# Patient Record
Sex: Female | Born: 1940 | Race: White | Hispanic: No
Health system: Southern US, Community
[De-identification: ages and names within clinical notes are randomized; demographics above are authoritative.]

---

## 2008-04-28 ENCOUNTER — Ambulatory Visit (HOSPITAL_COMMUNITY): Admission: RE | Admit: 2008-04-28 | Discharge: 2008-04-28 | Payer: Self-pay | Admitting: General Practice

## 2010-01-27 IMAGING — PT NM PET TUM IMG SKULL BASE T - THIGH
7 series · 25 of 25 positions shown · non-contrast
Comparison: [facility] chest CT 04/20/2008, [place] abdomen/pelvis CT 01/28/2008

CLINICAL DATA: Lung mass on outside chest CT

NUCLEAR MEDICINE WHOLE BODY PET/CT
TECHNIQUE: 15.7 mCi F-18 FDG was injected intravenously via the
right cephalic vein IV site.  Full-ring PET imaging was performed
from the skull vertex through the lower extremities 65 minutes
after injection.  CT data was obtained and used for attenuation
correction and anatomic localization only.  (This was not acquired
as a diagnostic CT examination.)
Fasting Blood Glucose:  115

[Series 1: pet ac · axial · 3.3mm · 4.69mm/px · z∈[-739,-13]mm · 5 of 223 slices shown]
[im 1/223]
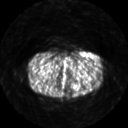
[im 56/223]
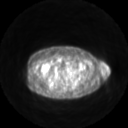
[im 112/223]
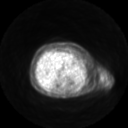
[im 167/223]
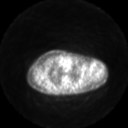
[im 223/223]
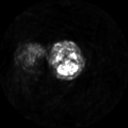

[Series 2: pet nac · axial · 3.3mm · 4.69mm/px · z∈[-739,-13]mm · 6 of 223 slices shown]
[im 1/223]
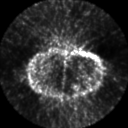
[im 45/223]
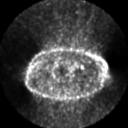
[im 89/223]
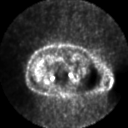
[im 134/223]
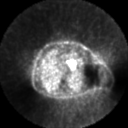
[im 178/223]
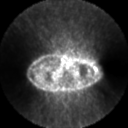
[im 223/223]
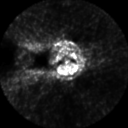

[Series 2: ct images · axial · 3.8mm · 0.98mm/px · z∈[-739,-13]mm · 5 of 223 slices shown]
[im 1/223]
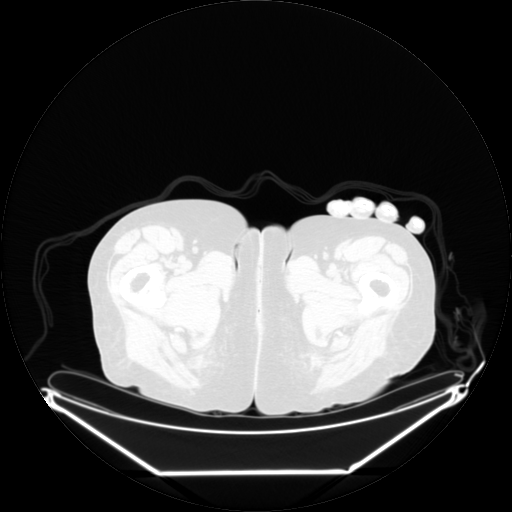
[im 56/223]
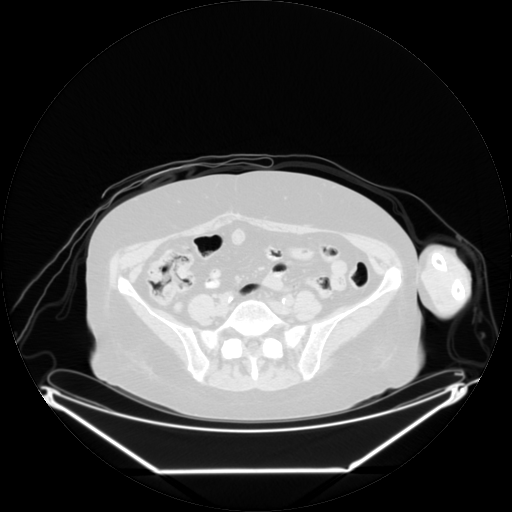
[im 112/223]
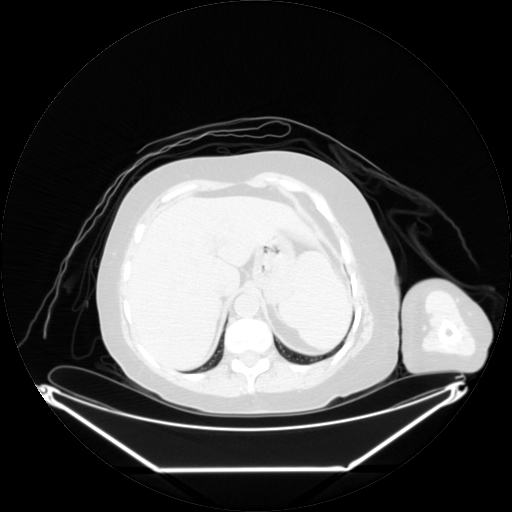
[im 167/223]
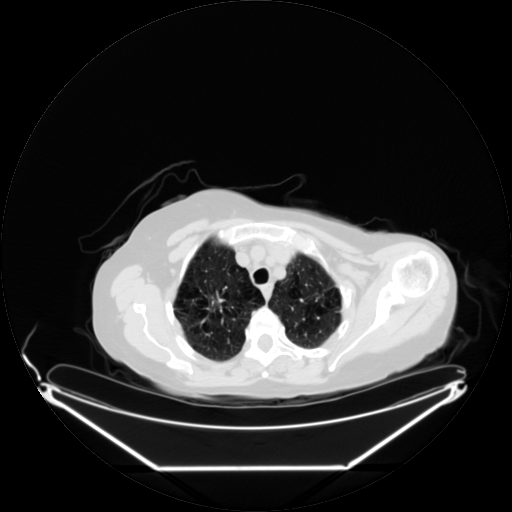
[im 223/223]
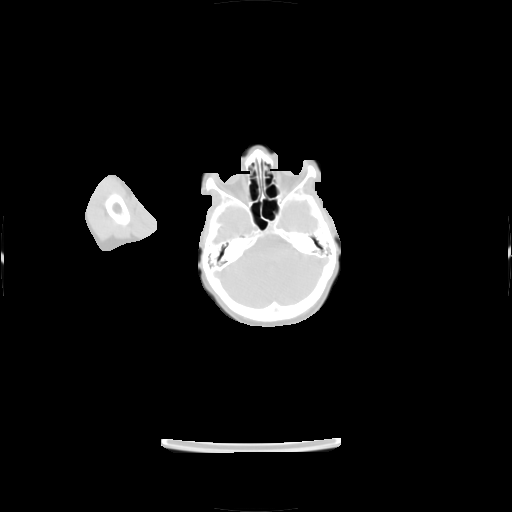

[Series 123: mip · coronal · 3.3mm · 4.69mm/px · 1 of 30 slices shown]
[im 1/30]
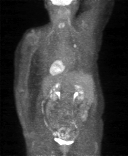

[Series 151: reformatted · axial · 3.3mm · 1.17mm/px · 1 of 1 slices shown (1 of 3)]
[im 1/1]
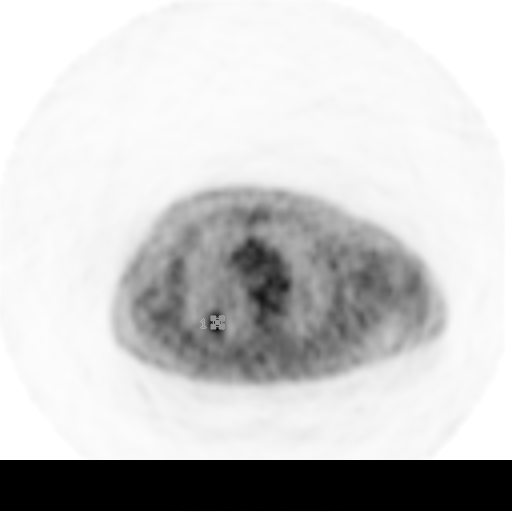

[Series 151: reformatted · axial · 3.3mm · 3.91mm/px · z∈[-736,-20]mm · 6 of 218 slices shown (2 of 3)]
[im 1/218]
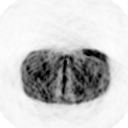
[im 44/218]
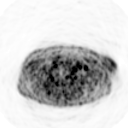
[im 87/218]
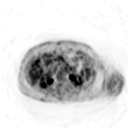
[im 131/218]
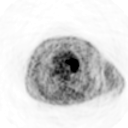
[im 174/218]
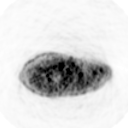
[im 218/218]
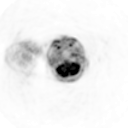

[Series 153: reformatted · coronal · 4.7mm · 5.83mm/px · 1 of 57 slices shown (3 of 3)]
[im 1/57]
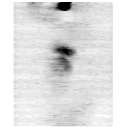

[25 of 25 positions shown; findings below may reference images not displayed]

FINDINGS: Abnormal F D G uptake is noted in the right upper lobe
corresponding to the previously seen mass, S U V maximum 4.0.

Low-level non mass-like uptake in the extra ocular muscles and
vocal cords is consistent with motion.  Low-level non mass-like
uptake in the right cervical strap muscles also suggests patient
motion versus brown fat.

Review of CT images obtained for attenuation correction purposes
only demonstrates a fat density mass within the sigmoid colon
without associated FDG uptake, compatible with a lipoma.  Other CT
findings are as per previous reports.
IMPRESSION: Abnormal F D G uptake in the right upper lobe corresponding to
previously seen pulmonary mass on dedicated chest CT, worrisome for
malignancy.

No abnormal F D G uptake in the neck, abdomen, or pelvis to suggest
metastasis.

## 2013-05-18 DIAGNOSIS — M48061 Spinal stenosis, lumbar region without neurogenic claudication: Secondary | ICD-10-CM | POA: Insufficient documentation

## 2013-05-18 DIAGNOSIS — G8929 Other chronic pain: Secondary | ICD-10-CM | POA: Insufficient documentation

## 2013-05-18 DIAGNOSIS — M5030 Other cervical disc degeneration, unspecified cervical region: Secondary | ICD-10-CM | POA: Insufficient documentation

## 2013-05-18 DIAGNOSIS — G35 Multiple sclerosis: Secondary | ICD-10-CM | POA: Insufficient documentation

## 2016-04-28 DIAGNOSIS — L03119 Cellulitis of unspecified part of limb: Secondary | ICD-10-CM | POA: Diagnosis not present

## 2016-04-28 DIAGNOSIS — L0291 Cutaneous abscess, unspecified: Secondary | ICD-10-CM | POA: Diagnosis not present

## 2016-04-29 DIAGNOSIS — L0291 Cutaneous abscess, unspecified: Secondary | ICD-10-CM | POA: Diagnosis not present

## 2016-04-29 DIAGNOSIS — L03119 Cellulitis of unspecified part of limb: Secondary | ICD-10-CM | POA: Diagnosis not present

## 2016-04-30 DIAGNOSIS — L0291 Cutaneous abscess, unspecified: Secondary | ICD-10-CM | POA: Diagnosis not present

## 2016-04-30 DIAGNOSIS — L03119 Cellulitis of unspecified part of limb: Secondary | ICD-10-CM | POA: Diagnosis not present

## 2016-05-01 ENCOUNTER — Encounter: Payer: Self-pay | Admitting: Sports Medicine

## 2016-05-01 DIAGNOSIS — L03119 Cellulitis of unspecified part of limb: Secondary | ICD-10-CM | POA: Diagnosis not present

## 2016-05-01 DIAGNOSIS — G92 Toxic encephalopathy: Secondary | ICD-10-CM

## 2016-05-01 DIAGNOSIS — L02612 Cutaneous abscess of left foot: Secondary | ICD-10-CM

## 2016-05-01 DIAGNOSIS — E119 Type 2 diabetes mellitus without complications: Secondary | ICD-10-CM

## 2016-05-01 DIAGNOSIS — R410 Disorientation, unspecified: Secondary | ICD-10-CM | POA: Diagnosis not present

## 2016-05-01 DIAGNOSIS — L97502 Non-pressure chronic ulcer of other part of unspecified foot with fat layer exposed: Secondary | ICD-10-CM | POA: Diagnosis not present

## 2016-05-02 DIAGNOSIS — R410 Disorientation, unspecified: Secondary | ICD-10-CM | POA: Diagnosis not present

## 2016-05-02 DIAGNOSIS — E119 Type 2 diabetes mellitus without complications: Secondary | ICD-10-CM | POA: Diagnosis not present

## 2016-05-02 DIAGNOSIS — G92 Toxic encephalopathy: Secondary | ICD-10-CM | POA: Diagnosis not present

## 2016-05-02 DIAGNOSIS — L02612 Cutaneous abscess of left foot: Secondary | ICD-10-CM | POA: Diagnosis not present

## 2016-05-03 DIAGNOSIS — E119 Type 2 diabetes mellitus without complications: Secondary | ICD-10-CM | POA: Diagnosis not present

## 2016-05-03 DIAGNOSIS — R410 Disorientation, unspecified: Secondary | ICD-10-CM | POA: Diagnosis not present

## 2016-05-03 DIAGNOSIS — G92 Toxic encephalopathy: Secondary | ICD-10-CM | POA: Diagnosis not present

## 2016-05-03 DIAGNOSIS — L02612 Cutaneous abscess of left foot: Secondary | ICD-10-CM | POA: Diagnosis not present

## 2016-05-04 DIAGNOSIS — E119 Type 2 diabetes mellitus without complications: Secondary | ICD-10-CM | POA: Diagnosis not present

## 2016-05-04 DIAGNOSIS — R410 Disorientation, unspecified: Secondary | ICD-10-CM | POA: Diagnosis not present

## 2016-05-04 DIAGNOSIS — G92 Toxic encephalopathy: Secondary | ICD-10-CM | POA: Diagnosis not present

## 2016-05-04 DIAGNOSIS — L02612 Cutaneous abscess of left foot: Secondary | ICD-10-CM | POA: Diagnosis not present

## 2016-05-16 ENCOUNTER — Ambulatory Visit (INDEPENDENT_AMBULATORY_CARE_PROVIDER_SITE_OTHER): Payer: Medicare Other | Admitting: Sports Medicine

## 2016-05-16 ENCOUNTER — Encounter: Payer: Self-pay | Admitting: Sports Medicine

## 2016-05-16 DIAGNOSIS — Z9889 Other specified postprocedural states: Secondary | ICD-10-CM

## 2016-05-16 DIAGNOSIS — L97521 Non-pressure chronic ulcer of other part of left foot limited to breakdown of skin: Secondary | ICD-10-CM | POA: Diagnosis not present

## 2016-05-16 DIAGNOSIS — G609 Hereditary and idiopathic neuropathy, unspecified: Secondary | ICD-10-CM

## 2016-05-16 NOTE — Progress Notes (Signed)
Subjective: Phyllis Hansen is a 74 y.o. female patient seen today in office for POV #1 (DOS 05/01/2016), S/P left foot incision and drainage performed at [facility] as a inpatient. Patient denies pain at surgical site, denies calf pain, denies headache, chest pain, shortness of breath, nausea, vomiting, fever, or chills. Patient states that she has home nursing coming every other day dressing the wound. States that her foot feels itchy sometimes like it is healing. Patient is currently on antibiotics with no problems or issues.   Patient is assisted by daughter at this visit.  Patient Active Problem List   Diagnosis Date Noted  . Chronic pain 05/18/2013  . DDD (degenerative disc disease), cervical 05/18/2013  . Multiple sclerosis (HCC) 05/18/2013  . Spinal stenosis of lumbar region 05/18/2013    No current outpatient prescriptions on file prior to visit.   No current facility-administered medications on file prior to visit.     Allergies  Allergen Reactions  . Amitriptyline   . Celecoxib   . Rofecoxib   . Sulfa Antibiotics Rash    Objective: There were no vitals filed for this visit.  General: No acute distress, AAOx3  Left foot:Plantar incision and drainage wound site, sub-met 5 measures 3 cm in length by 0.5 cm in width and 0.2 cm in depth does not probe to bone granular base, mildly keratotic margin, no active drainage, no erythema, no erythema, no warmth, no drainage, no signs of infection noted, minimal swelling to left foot, Capillary fill time <3 seconds in all digits, gross sensation present via light touch to left foot however protective sensation diminished. No pain or crepitation with range of motion left foot.  No pain with calf compression.   Assessment and Plan:  Problem List Items Addressed This Visit    None    Visit Diagnoses    Status post left foot surgery    -  Primary   Foot ulceration, left, limited to breakdown of skin (HCC)       Idiopathic  neuropathy       Relevant Medications   diazepam (VALIUM) 5 MG tablet   gabapentin (NEURONTIN) 300 MG capsule   temazepam (RESTORIL) 15 MG capsule       -Patient seen and evaluated -Cleansed ulceration and debrided surrounding keratotic tissue using a tissue nipper then applied antibiotic cream and dry sterile dressing to surgical site left foot secured with ACE wrap and stockinet  -Continue with home nursing for assistance with dressing changes -Advised patient to continue with post-op shoe on left foot  as tolerated -Advised patient to limit activity to necessity  -Advised patient to ice and elevate as necessary  -Will plan for x-rays and continue wound care at next visit. In the meantime, patient to call office if any issues or problems arise.    , DPM  

## 2016-05-21 DIAGNOSIS — G629 Polyneuropathy, unspecified: Secondary | ICD-10-CM | POA: Diagnosis not present

## 2016-05-21 DIAGNOSIS — I10 Essential (primary) hypertension: Secondary | ICD-10-CM

## 2016-05-21 DIAGNOSIS — J449 Chronic obstructive pulmonary disease, unspecified: Secondary | ICD-10-CM

## 2016-05-21 DIAGNOSIS — G35 Multiple sclerosis: Secondary | ICD-10-CM

## 2016-05-21 DIAGNOSIS — K5669 Other intestinal obstruction: Secondary | ICD-10-CM

## 2016-05-21 DIAGNOSIS — N179 Acute kidney failure, unspecified: Secondary | ICD-10-CM

## 2016-05-22 DIAGNOSIS — K5669 Other intestinal obstruction: Secondary | ICD-10-CM | POA: Diagnosis not present

## 2016-05-22 DIAGNOSIS — I10 Essential (primary) hypertension: Secondary | ICD-10-CM

## 2016-05-22 DIAGNOSIS — J449 Chronic obstructive pulmonary disease, unspecified: Secondary | ICD-10-CM

## 2016-05-22 DIAGNOSIS — G629 Polyneuropathy, unspecified: Secondary | ICD-10-CM | POA: Diagnosis not present

## 2016-05-23 DIAGNOSIS — G629 Polyneuropathy, unspecified: Secondary | ICD-10-CM | POA: Diagnosis not present

## 2016-05-23 DIAGNOSIS — I10 Essential (primary) hypertension: Secondary | ICD-10-CM | POA: Diagnosis not present

## 2016-05-23 DIAGNOSIS — J449 Chronic obstructive pulmonary disease, unspecified: Secondary | ICD-10-CM | POA: Diagnosis not present

## 2016-05-23 DIAGNOSIS — K5669 Other intestinal obstruction: Secondary | ICD-10-CM | POA: Diagnosis not present

## 2016-05-24 DIAGNOSIS — I10 Essential (primary) hypertension: Secondary | ICD-10-CM | POA: Diagnosis not present

## 2016-05-24 DIAGNOSIS — K5669 Other intestinal obstruction: Secondary | ICD-10-CM | POA: Diagnosis not present

## 2016-05-24 DIAGNOSIS — G629 Polyneuropathy, unspecified: Secondary | ICD-10-CM | POA: Diagnosis not present

## 2016-05-24 DIAGNOSIS — J449 Chronic obstructive pulmonary disease, unspecified: Secondary | ICD-10-CM | POA: Diagnosis not present

## 2016-05-27 DIAGNOSIS — I10 Essential (primary) hypertension: Secondary | ICD-10-CM | POA: Diagnosis not present

## 2016-05-27 DIAGNOSIS — J449 Chronic obstructive pulmonary disease, unspecified: Secondary | ICD-10-CM | POA: Diagnosis not present

## 2016-05-27 DIAGNOSIS — G629 Polyneuropathy, unspecified: Secondary | ICD-10-CM | POA: Diagnosis not present

## 2016-05-27 DIAGNOSIS — K5669 Other intestinal obstruction: Secondary | ICD-10-CM | POA: Diagnosis not present

## 2016-05-28 DIAGNOSIS — I10 Essential (primary) hypertension: Secondary | ICD-10-CM | POA: Diagnosis not present

## 2016-05-28 DIAGNOSIS — J449 Chronic obstructive pulmonary disease, unspecified: Secondary | ICD-10-CM | POA: Diagnosis not present

## 2016-05-28 DIAGNOSIS — K5669 Other intestinal obstruction: Secondary | ICD-10-CM | POA: Diagnosis not present

## 2016-05-28 DIAGNOSIS — G629 Polyneuropathy, unspecified: Secondary | ICD-10-CM | POA: Diagnosis not present

## 2016-05-30 ENCOUNTER — Telehealth: Payer: Self-pay

## 2016-05-30 NOTE — Telephone Encounter (Signed)
Returning phone call from [facility], Alcario Drought, to return my phone call regarding patient [phone]

## 2016-05-30 NOTE — Telephone Encounter (Signed)
Erica from [facility], called stating that the patient's foot looks much better, incision is closed and there are no open wounds or areas, advised her to keep area covered with light bandage or wrap and a sock and if there were any changes in her post op care we would notify them after her next f/u appt

## 2016-05-31 ENCOUNTER — Ambulatory Visit (INDEPENDENT_AMBULATORY_CARE_PROVIDER_SITE_OTHER): Payer: Medicare Other | Admitting: Sports Medicine

## 2016-05-31 ENCOUNTER — Encounter: Payer: Self-pay | Admitting: Sports Medicine

## 2016-05-31 VITALS — Ht 62.0 in | Wt 112.0 lb

## 2016-05-31 DIAGNOSIS — L97521 Non-pressure chronic ulcer of other part of left foot limited to breakdown of skin: Secondary | ICD-10-CM

## 2016-05-31 DIAGNOSIS — G609 Hereditary and idiopathic neuropathy, unspecified: Secondary | ICD-10-CM

## 2016-05-31 DIAGNOSIS — Z9889 Other specified postprocedural states: Secondary | ICD-10-CM

## 2016-05-31 NOTE — Telephone Encounter (Signed)
Called Phyllis Hansen at [facility] informed her of Dr Wynema Birch orders that if the surgical site continued to look well, then d/c [facility]

## 2016-05-31 NOTE — Progress Notes (Signed)
Subjective: Phyllis Hansen is a 75 y.o. female patient seen today in office for POV #2 (DOS 05/01/2016), S/P left foot incision and drainage performed at [facility] while inpatient. Patient denies pain at surgical site, denies calf pain, denies headache, chest pain, shortness of breath, nausea, vomiting, fever, or chills. Patient states that she has home nursing coming every other day dressing the wound. States that her foot still feels itchy sometimes like it is healing. Patient finished antibiotics with no problems or issues however states that she was hospitalized this week for blockage of her stomach and had to have a tube place. Reports that she may have another stomach procedure in 1 month.   Patient is assisted by daughter and son at this visit who remain in waiting room.  Patient Active Problem List   Diagnosis Date Noted  . Chronic pain 05/18/2013  . DDD (degenerative disc disease), cervical 05/18/2013  . Multiple sclerosis ([place]) 05/18/2013  . Spinal stenosis of lumbar region 05/18/2013    Current Outpatient Prescriptions on File Prior to Visit  Medication Sig Dispense Refill  . ADVAIR DISKUS 250-50 MCG/DOSE AEPB     . alendronate (FOSAMAX) 70 MG tablet     . amoxicillin-clavulanate (AUGMENTIN) 875-125 MG tablet TK 1 T PO  BID  0  . celecoxib (CELEBREX) 200 MG capsule     . clotrimazole-betamethasone (LOTRISONE) cream     . diazepam (VALIUM) 5 MG tablet TK 1 T PO QD  0  . doxycycline (VIBRA-TABS) 100 MG tablet 100 mg.    . doxycycline (VIBRA-TABS) 100 MG tablet TK 1 T PO  BID  0  . fluconazole (DIFLUCAN) 100 MG tablet     . Fluticasone-Salmeterol (ADVAIR DISKUS) 100-50 MCG/DOSE AEPB Frequency:   Dosage:500-50   MCG/DOSE  Instructions:Advair Diskus (500-50MCG/DOSE MISC, Inhalation )  Note:    . gabapentin (NEURONTIN) 300 MG capsule 300 mg.    . HYDROcodone-acetaminophen (NORCO) 7.5-325 MG tablet     . HYDROcodone-acetaminophen (NORCO/VICODIN) 5-325 MG tablet TK 1 T PO TID   0  . Hydrocodone-Acetaminophen 10-325 MG/15ML SOLN Frequency:as needed   Dosage:5-500   MG  Instructions:Hydrocodone-Acetaminophen (5-'500MG'$  CAPS, Oral as needed)  Note:    . lisinopril (PRINIVIL,ZESTRIL) 10 MG tablet     . lisinopril (PRINIVIL,ZESTRIL) 5 MG tablet     . lisinopril-hydrochlorothiazide (PRINZIDE,ZESTORETIC) 20-25 MG tablet Frequency:daily   Dosage:20-25   MG  Instructions:Lisinopril-Hydrochlorothiazide (20-'25MG'$  TABS, 1 Oral daily)  Note:    . losartan (COZAAR) 50 MG tablet 50 mg.    . neomycin-polymyxin b-dexamethasone (MAXITROL) 3.5-10000-0.1 OINT     . omeprazole (PRILOSEC) 40 MG capsule     . omeprazole (PRILOSEC) 40 MG capsule 40 mg.    . ondansetron (ZOFRAN) 4 MG tablet     . oxyCODONE (OXY IR/ROXICODONE) 5 MG immediate release tablet TK 1 T PO  Q 6 H PRN P  0  . PROAIR HFA 108 (90 Base) MCG/ACT inhaler     . sulfamethoxazole-trimethoprim (BACTRIM DS,SEPTRA DS) 800-160 MG tablet TK 1 T PO  BID  0  . temazepam (RESTORIL) 15 MG capsule TK 1 C PO QD HS  0  . theophylline (THEODUR) 300 MG 12 hr tablet TK 1 T PO TID  0  . tiotropium (SPIRIVA) 18 MCG inhalation capsule Frequency:daily   Dosage:18   MCG  Instructions:Spiriva HandiHaler (18MCG CAPS, 1 Inhalation daily)  Note:     No current facility-administered medications on file prior to visit.     Allergies  Allergen Reactions  . Amitriptyline   . Celecoxib   . Rofecoxib   . Sulfa Antibiotics Rash    Objective: Vitals:   05/31/16 1351  Weight: 112 lb (50.8 kg)  Height: '5\' 2"'$  (1.575 m)    General: No acute distress, AAOx3  Left foot:Plantar incision and drainage wound site, sub-met 5 is now healed with mild reactive keratosis, no erythema, no erythema, no warmth, no drainage, no signs of infection noted, Asymptomatic callus sub met 1 and hallux on left, minimal swelling to left foot, Capillary fill time <3 seconds in all digits, gross sensation present via light touch to left foot however protective sensation  diminished. No pain or crepitation with range of motion left foot.  No pain with calf compression.   Assessment and Plan:  Problem List Items Addressed This Visit    None    Visit Diagnoses    Status post left foot surgery    -  Primary   Foot ulceration, left, limited to breakdown of skin ([place])       healed   Idiopathic neuropathy          -Patient seen and evaluated -Cleansed healed ulceration site and debrided surrounding keratotic tissue using chisel blade. There was no underlying opening then applied protective antibiotic cream and bandaid dressing -Area is healed so after today no dressings are needed. Patient may shower as normal. -Recommend home nurse to return for final discharge visit/wound check next week and if remains healed can discontinue wound care. -Advised patient to continue with post-op shoe on left foot until normal shoe can be tolerated -Advised patient to limit activity to necessity  -Advised patient to ice and elevate as necessary  And closely monitor healed surgical site, if area worsens to return to office or go to ER immediately  -Patient to return in 1 month for final recheck of healed surgical site. In the meantime, patient to call office if any issues or problems arise.   Landis Martins, DPM

## 2016-06-28 ENCOUNTER — Encounter: Payer: Medicare Other | Admitting: Sports Medicine

## 2016-07-04 ENCOUNTER — Encounter: Payer: Medicare Other | Admitting: Sports Medicine

## 2016-07-05 ENCOUNTER — Encounter: Payer: Medicare Other | Admitting: Sports Medicine

## 2016-07-21 DIAGNOSIS — G8929 Other chronic pain: Secondary | ICD-10-CM | POA: Diagnosis not present

## 2016-07-21 DIAGNOSIS — M25532 Pain in left wrist: Secondary | ICD-10-CM

## 2016-07-21 DIAGNOSIS — B373 Candidiasis of vulva and vagina: Secondary | ICD-10-CM

## 2016-07-21 DIAGNOSIS — J449 Chronic obstructive pulmonary disease, unspecified: Secondary | ICD-10-CM

## 2016-07-21 DIAGNOSIS — I10 Essential (primary) hypertension: Secondary | ICD-10-CM

## 2016-07-21 DIAGNOSIS — N390 Urinary tract infection, site not specified: Secondary | ICD-10-CM

## 2016-07-21 DIAGNOSIS — M810 Age-related osteoporosis without current pathological fracture: Secondary | ICD-10-CM

## 2016-07-21 DIAGNOSIS — K219 Gastro-esophageal reflux disease without esophagitis: Secondary | ICD-10-CM

## 2016-07-21 DIAGNOSIS — A414 Sepsis due to anaerobes: Secondary | ICD-10-CM

## 2016-07-21 DIAGNOSIS — Z720 Tobacco use: Secondary | ICD-10-CM

## 2016-07-21 DIAGNOSIS — I2510 Atherosclerotic heart disease of native coronary artery without angina pectoris: Secondary | ICD-10-CM | POA: Diagnosis not present

## 2016-07-22 DIAGNOSIS — G8929 Other chronic pain: Secondary | ICD-10-CM | POA: Diagnosis not present

## 2016-07-22 DIAGNOSIS — A414 Sepsis due to anaerobes: Secondary | ICD-10-CM | POA: Diagnosis not present

## 2016-07-22 DIAGNOSIS — I2510 Atherosclerotic heart disease of native coronary artery without angina pectoris: Secondary | ICD-10-CM | POA: Diagnosis not present

## 2016-07-22 DIAGNOSIS — M810 Age-related osteoporosis without current pathological fracture: Secondary | ICD-10-CM | POA: Diagnosis not present

## 2016-07-23 DIAGNOSIS — A414 Sepsis due to anaerobes: Secondary | ICD-10-CM | POA: Diagnosis not present

## 2016-07-23 DIAGNOSIS — I2510 Atherosclerotic heart disease of native coronary artery without angina pectoris: Secondary | ICD-10-CM | POA: Diagnosis not present

## 2016-07-23 DIAGNOSIS — G8929 Other chronic pain: Secondary | ICD-10-CM | POA: Diagnosis not present

## 2016-07-23 DIAGNOSIS — M810 Age-related osteoporosis without current pathological fracture: Secondary | ICD-10-CM | POA: Diagnosis not present

## 2017-04-18 ENCOUNTER — Ambulatory Visit (INDEPENDENT_AMBULATORY_CARE_PROVIDER_SITE_OTHER): Payer: Medicare HMO | Admitting: Sports Medicine

## 2017-04-18 DIAGNOSIS — G609 Hereditary and idiopathic neuropathy, unspecified: Secondary | ICD-10-CM

## 2017-04-18 DIAGNOSIS — M79672 Pain in left foot: Secondary | ICD-10-CM

## 2017-04-18 DIAGNOSIS — Q828 Other specified congenital malformations of skin: Secondary | ICD-10-CM

## 2017-04-18 NOTE — Progress Notes (Signed)
Subjective: Phyllis Hansen is a 76 y.o. female patient who presents to office for evaluation of Left foot pain secondary to callus skin. Patient complains of pain at the lesion present Left foot sub met 5. Patient has tried cushions and pads with no relief in symptoms. Patient denies any other pedal complaints. Admits being most recently since from Sulfa antibiotics.   Patient is assisted by daughter.  Patient Active Problem List   Diagnosis Date Noted  . Chronic pain 05/18/2013  . DDD (degenerative disc disease), cervical 05/18/2013  . Multiple sclerosis ([place]) 05/18/2013  . Spinal stenosis of lumbar region 05/18/2013    Current Outpatient Prescriptions on File Prior to Visit  Medication Sig Dispense Refill  . ADVAIR DISKUS 250-50 MCG/DOSE AEPB     . alendronate (FOSAMAX) 70 MG tablet     . amoxicillin-clavulanate (AUGMENTIN) 875-125 MG tablet TK 1 T PO  BID  0  . celecoxib (CELEBREX) 200 MG capsule     . clotrimazole-betamethasone (LOTRISONE) cream     . diazepam (VALIUM) 5 MG tablet TK 1 T PO QD  0  . doxycycline (VIBRA-TABS) 100 MG tablet 100 mg.    . doxycycline (VIBRA-TABS) 100 MG tablet TK 1 T PO  BID  0  . fluconazole (DIFLUCAN) 100 MG tablet     . Fluticasone-Salmeterol (ADVAIR DISKUS) 100-50 MCG/DOSE AEPB Frequency:   Dosage:500-50   MCG/DOSE  Instructions:Advair Diskus (500-50MCG/DOSE MISC, Inhalation )  Note:    . gabapentin (NEURONTIN) 300 MG capsule 300 mg.    . HYDROcodone-acetaminophen (NORCO) 7.5-325 MG tablet     . HYDROcodone-acetaminophen (NORCO/VICODIN) 5-325 MG tablet TK 1 T PO TID  0  . Hydrocodone-Acetaminophen 10-325 MG/15ML SOLN Frequency:as needed   Dosage:5-500   MG  Instructions:Hydrocodone-Acetaminophen (5-500MG CAPS, Oral as needed)  Note:    . lisinopril (PRINIVIL,ZESTRIL) 10 MG tablet     . lisinopril (PRINIVIL,ZESTRIL) 5 MG tablet     . lisinopril-hydrochlorothiazide (PRINZIDE,ZESTORETIC) 20-25 MG tablet Frequency:daily   Dosage:20-25   MG   Instructions:Lisinopril-Hydrochlorothiazide (20-25MG TABS, 1 Oral daily)  Note:    . losartan (COZAAR) 50 MG tablet 50 mg.    . neomycin-polymyxin b-dexamethasone (MAXITROL) 3.5-10000-0.1 OINT     . omeprazole (PRILOSEC) 40 MG capsule     . omeprazole (PRILOSEC) 40 MG capsule 40 mg.    . ondansetron (ZOFRAN) 4 MG tablet     . oxyCODONE (OXY IR/ROXICODONE) 5 MG immediate release tablet TK 1 T PO  Q 6 H PRN P  0  . PROAIR HFA 108 (90 Base) MCG/ACT inhaler     . sulfamethoxazole-trimethoprim (BACTRIM DS,SEPTRA DS) 800-160 MG tablet TK 1 T PO  BID  0  . temazepam (RESTORIL) 15 MG capsule TK 1 C PO QD HS  0  . theophylline (THEODUR) 300 MG 12 hr tablet TK 1 T PO TID  0  . tiotropium (SPIRIVA) 18 MCG inhalation capsule Frequency:daily   Dosage:18   MCG  Instructions:Spiriva HandiHaler (18MCG CAPS, 1 Inhalation daily)  Note:     No current facility-administered medications on file prior to visit.     Allergies  Allergen Reactions  . Amitriptyline   . Celecoxib   . Rofecoxib   . Sulfa Antibiotics Rash    Objective:  General: Alert and oriented x3 in no acute distress  Dermatology: Keratotic lesion present sub met 5 on left with skin lines transversing the lesion, pain is present with direct pressure to the lesion with a central nucleated core noted, there  is also minimal callus sub met 1 and hallux on left as well, no webspace macerations, no ecchymosis bilateral, all nails x 10 are well manicured.  Vascular: Dorsalis Pedis and Posterior Tibial pedal pulses 1/4, Capillary Fill Time 5 seconds, - pedal hair growth bilateral, trace edema bilateral lower extremities, + varcosities, Temperature gradient within normal limits.  Neurology: Johney Maine sensation intact via light touch bilateral however protective is diminished  Musculoskeletal: Mild tenderness with palpation at the keratotic lesion site sub met 5 on left, Muscular strength 5/5 in all groups without pain or limitation on range of motion. +  prominent 5th metatarsal head on left. Assessment and Plan: Problem List Items Addressed This Visit    None    Visit Diagnoses    Porokeratosis    -  Primary   Left foot pain       Idiopathic neuropathy          -Complete examination performed -Discussed treatment options -Parred keratoic lesion using a chisel blade; treated the area withSalinocaine covered with moleskin offloading padding  -Encouraged daily skin emollients -Encouraged use of pumice stone -Advised good supportive shoes and inserts -Patient to return to office as needed or sooner if condition worsens.  Landis Martins, DPM

## 2017-10-15 DIAGNOSIS — J9601 Acute respiratory failure with hypoxia: Secondary | ICD-10-CM

## 2017-10-15 DIAGNOSIS — A419 Sepsis, unspecified organism: Secondary | ICD-10-CM

## 2017-10-15 DIAGNOSIS — J189 Pneumonia, unspecified organism: Secondary | ICD-10-CM | POA: Diagnosis not present

## 2017-10-15 DIAGNOSIS — G35 Multiple sclerosis: Secondary | ICD-10-CM

## 2017-10-15 DIAGNOSIS — E119 Type 2 diabetes mellitus without complications: Secondary | ICD-10-CM

## 2017-10-15 DIAGNOSIS — J449 Chronic obstructive pulmonary disease, unspecified: Secondary | ICD-10-CM

## 2017-10-16 DIAGNOSIS — J9601 Acute respiratory failure with hypoxia: Secondary | ICD-10-CM | POA: Diagnosis not present

## 2017-10-16 DIAGNOSIS — J189 Pneumonia, unspecified organism: Secondary | ICD-10-CM | POA: Diagnosis not present

## 2017-10-16 DIAGNOSIS — A419 Sepsis, unspecified organism: Secondary | ICD-10-CM | POA: Diagnosis not present

## 2017-10-16 DIAGNOSIS — E119 Type 2 diabetes mellitus without complications: Secondary | ICD-10-CM | POA: Diagnosis not present

## 2017-10-17 DIAGNOSIS — G35 Multiple sclerosis: Secondary | ICD-10-CM | POA: Diagnosis not present

## 2017-10-17 DIAGNOSIS — E119 Type 2 diabetes mellitus without complications: Secondary | ICD-10-CM | POA: Diagnosis not present

## 2017-10-17 DIAGNOSIS — J189 Pneumonia, unspecified organism: Secondary | ICD-10-CM | POA: Diagnosis not present

## 2017-10-17 DIAGNOSIS — J9601 Acute respiratory failure with hypoxia: Secondary | ICD-10-CM | POA: Diagnosis not present

## 2017-10-17 DIAGNOSIS — A419 Sepsis, unspecified organism: Secondary | ICD-10-CM | POA: Diagnosis not present

## 2017-10-18 DIAGNOSIS — A419 Sepsis, unspecified organism: Secondary | ICD-10-CM | POA: Diagnosis not present

## 2017-10-18 DIAGNOSIS — E119 Type 2 diabetes mellitus without complications: Secondary | ICD-10-CM | POA: Diagnosis not present

## 2017-10-18 DIAGNOSIS — J189 Pneumonia, unspecified organism: Secondary | ICD-10-CM | POA: Diagnosis not present

## 2017-10-18 DIAGNOSIS — J9601 Acute respiratory failure with hypoxia: Secondary | ICD-10-CM | POA: Diagnosis not present

## 2017-10-19 DIAGNOSIS — A419 Sepsis, unspecified organism: Secondary | ICD-10-CM | POA: Diagnosis not present

## 2017-10-19 DIAGNOSIS — J9601 Acute respiratory failure with hypoxia: Secondary | ICD-10-CM | POA: Diagnosis not present

## 2017-10-19 DIAGNOSIS — J189 Pneumonia, unspecified organism: Secondary | ICD-10-CM | POA: Diagnosis not present

## 2017-10-19 DIAGNOSIS — R0602 Shortness of breath: Secondary | ICD-10-CM | POA: Diagnosis not present

## 2017-10-19 DIAGNOSIS — E119 Type 2 diabetes mellitus without complications: Secondary | ICD-10-CM | POA: Diagnosis not present

## 2017-10-20 DIAGNOSIS — J9601 Acute respiratory failure with hypoxia: Secondary | ICD-10-CM | POA: Diagnosis not present

## 2017-10-20 DIAGNOSIS — A419 Sepsis, unspecified organism: Secondary | ICD-10-CM | POA: Diagnosis not present

## 2017-10-20 DIAGNOSIS — E119 Type 2 diabetes mellitus without complications: Secondary | ICD-10-CM | POA: Diagnosis not present

## 2017-10-20 DIAGNOSIS — J189 Pneumonia, unspecified organism: Secondary | ICD-10-CM | POA: Diagnosis not present

## 2017-10-21 DIAGNOSIS — A419 Sepsis, unspecified organism: Secondary | ICD-10-CM | POA: Diagnosis not present

## 2017-10-21 DIAGNOSIS — E119 Type 2 diabetes mellitus without complications: Secondary | ICD-10-CM | POA: Diagnosis not present

## 2017-10-21 DIAGNOSIS — J9601 Acute respiratory failure with hypoxia: Secondary | ICD-10-CM | POA: Diagnosis not present

## 2017-10-21 DIAGNOSIS — J189 Pneumonia, unspecified organism: Secondary | ICD-10-CM | POA: Diagnosis not present

## 2017-10-22 DIAGNOSIS — G35 Multiple sclerosis: Secondary | ICD-10-CM | POA: Diagnosis not present

## 2017-10-22 DIAGNOSIS — J189 Pneumonia, unspecified organism: Secondary | ICD-10-CM | POA: Diagnosis not present

## 2017-10-22 DIAGNOSIS — A419 Sepsis, unspecified organism: Secondary | ICD-10-CM | POA: Diagnosis not present

## 2017-10-22 DIAGNOSIS — J9601 Acute respiratory failure with hypoxia: Secondary | ICD-10-CM | POA: Diagnosis not present

## 2017-10-22 DIAGNOSIS — E119 Type 2 diabetes mellitus without complications: Secondary | ICD-10-CM | POA: Diagnosis not present

## 2019-10-05 DIAGNOSIS — K219 Gastro-esophageal reflux disease without esophagitis: Secondary | ICD-10-CM

## 2019-10-05 DIAGNOSIS — I10 Essential (primary) hypertension: Secondary | ICD-10-CM

## 2019-10-05 DIAGNOSIS — F410 Panic disorder [episodic paroxysmal anxiety] without agoraphobia: Secondary | ICD-10-CM

## 2019-10-05 DIAGNOSIS — N390 Urinary tract infection, site not specified: Secondary | ICD-10-CM

## 2019-10-05 DIAGNOSIS — J449 Chronic obstructive pulmonary disease, unspecified: Secondary | ICD-10-CM

## 2019-10-06 DIAGNOSIS — J449 Chronic obstructive pulmonary disease, unspecified: Secondary | ICD-10-CM | POA: Diagnosis not present

## 2019-10-06 DIAGNOSIS — I10 Essential (primary) hypertension: Secondary | ICD-10-CM | POA: Diagnosis not present

## 2019-10-06 DIAGNOSIS — F410 Panic disorder [episodic paroxysmal anxiety] without agoraphobia: Secondary | ICD-10-CM | POA: Diagnosis not present

## 2019-10-06 DIAGNOSIS — N390 Urinary tract infection, site not specified: Secondary | ICD-10-CM | POA: Diagnosis not present

## 2019-10-07 DIAGNOSIS — I10 Essential (primary) hypertension: Secondary | ICD-10-CM | POA: Diagnosis not present

## 2019-10-07 DIAGNOSIS — N390 Urinary tract infection, site not specified: Secondary | ICD-10-CM | POA: Diagnosis not present

## 2019-10-07 DIAGNOSIS — J449 Chronic obstructive pulmonary disease, unspecified: Secondary | ICD-10-CM | POA: Diagnosis not present

## 2019-10-07 DIAGNOSIS — F410 Panic disorder [episodic paroxysmal anxiety] without agoraphobia: Secondary | ICD-10-CM | POA: Diagnosis not present

## 2019-10-08 DIAGNOSIS — F410 Panic disorder [episodic paroxysmal anxiety] without agoraphobia: Secondary | ICD-10-CM | POA: Diagnosis not present

## 2019-10-08 DIAGNOSIS — N390 Urinary tract infection, site not specified: Secondary | ICD-10-CM | POA: Diagnosis not present

## 2019-10-08 DIAGNOSIS — J449 Chronic obstructive pulmonary disease, unspecified: Secondary | ICD-10-CM | POA: Diagnosis not present

## 2019-10-08 DIAGNOSIS — I10 Essential (primary) hypertension: Secondary | ICD-10-CM | POA: Diagnosis not present

## 2021-07-17 DEATH — deceased
